# Patient Record
Sex: Female | Born: 2000 | Race: White | Hispanic: No | Marital: Single | Smoking: Never smoker
Health system: Southern US, Community
[De-identification: ages and names within clinical notes are randomized; demographics above are authoritative.]

---
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
## 2019-03-17 ENCOUNTER — Other Ambulatory Visit: Payer: Self-pay

## 2019-03-17 DIAGNOSIS — Z20822 Contact with and (suspected) exposure to covid-19: Secondary | ICD-10-CM

## 2019-03-18 LAB — NOVEL CORONAVIRUS, NAA: SARS-CoV-2, NAA: NOT DETECTED

## 2019-03-23 ENCOUNTER — Other Ambulatory Visit: Payer: Self-pay

## 2019-03-23 DIAGNOSIS — Z20822 Contact with and (suspected) exposure to covid-19: Secondary | ICD-10-CM

## 2019-03-24 LAB — NOVEL CORONAVIRUS, NAA: SARS-CoV-2, NAA: NOT DETECTED

## 2019-03-30 ENCOUNTER — Other Ambulatory Visit: Payer: Self-pay

## 2019-03-30 DIAGNOSIS — Z20822 Contact with and (suspected) exposure to covid-19: Secondary | ICD-10-CM

## 2019-03-31 LAB — NOVEL CORONAVIRUS, NAA: SARS-CoV-2, NAA: NOT DETECTED

## 2019-04-29 ENCOUNTER — Other Ambulatory Visit: Payer: Self-pay | Admitting: *Deleted

## 2019-04-29 DIAGNOSIS — Z20822 Contact with and (suspected) exposure to covid-19: Secondary | ICD-10-CM

## 2019-05-01 LAB — NOVEL CORONAVIRUS, NAA: SARS-CoV-2, NAA: NOT DETECTED

## 2019-05-10 ENCOUNTER — Other Ambulatory Visit: Payer: Self-pay

## 2019-05-10 ENCOUNTER — Telehealth: Payer: No Typology Code available for payment source | Admitting: Family Medicine

## 2019-05-10 DIAGNOSIS — Z20822 Contact with and (suspected) exposure to covid-19: Secondary | ICD-10-CM

## 2019-05-12 LAB — NOVEL CORONAVIRUS, NAA: SARS-CoV-2, NAA: NOT DETECTED

## 2019-07-22 ENCOUNTER — Other Ambulatory Visit: Payer: Self-pay | Admitting: Family Medicine

## 2019-07-22 ENCOUNTER — Ambulatory Visit
Admission: RE | Admit: 2019-07-22 | Discharge: 2019-07-22 | Disposition: A | Discharge status: Home / Self Care | Payer: PRIVATE HEALTH INSURANCE | Source: Ambulatory Visit | Attending: Family Medicine | Admitting: Family Medicine

## 2019-07-22 ENCOUNTER — Other Ambulatory Visit: Payer: Self-pay

## 2019-07-22 DIAGNOSIS — M79645 Pain in left finger(s): Secondary | ICD-10-CM

## 2019-07-22 DIAGNOSIS — M79642 Pain in left hand: Secondary | ICD-10-CM

## 2019-10-24 ENCOUNTER — Other Ambulatory Visit: Payer: Self-pay | Admitting: Family

## 2019-10-24 ENCOUNTER — Ambulatory Visit: Payer: Self-pay | Admitting: Family

## 2019-10-24 DIAGNOSIS — U071 COVID-19: Secondary | ICD-10-CM

## 2019-10-24 NOTE — Progress Notes (Signed)
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  EKG obtained and sent for check off by cardiology.  Pts will be notified of all results.

## 2019-11-10 ENCOUNTER — Ambulatory Visit: Payer: PRIVATE HEALTH INSURANCE | Admitting: Family

## 2019-11-10 ENCOUNTER — Other Ambulatory Visit: Payer: Self-pay

## 2019-11-10 VITALS — BP 125/73 | HR 95 | Temp 98.8°F | Resp 14

## 2019-11-10 DIAGNOSIS — Z8616 Personal history of COVID-19: Secondary | ICD-10-CM

## 2019-11-10 NOTE — Progress Notes (Signed)
   Acute Office Visit  Subjective:    Patient ID: Monica Wall, female    DOB: [birth year], 19 y.o.   MRN: [id number]  Chief Complaint  Patient presents with  . Follow-up    HPI Patient is in today for release to play post Covid.  [employer] and [employer] Med guidelines recently changed stating that asymptomatic/mild symptom patients may be released for gradual return to play without a cardiac workup pending provider exam and judgement.  Pt had 100 degree temp on day 1 post 2nd Covid 19 vaccine. Pt tested positive a few days after vaccine.  Mild congestion.  Denies any hx of chest pain, palpitations or SOB.  Pt  Has been asymptomatic for over 2 weeks.  No past medical history on file.  Past Surgical History:  Procedure Laterality Date  . KNEE SURGERY Right 08/2017    No family history on file.    Allergies  Allergen Reactions  . Bee Venom Hives  . Cefzil [Cefprozil] Hives  . Penicillins   . Zithromax [Azithromycin] Hives    Review of Systems  Constitutional: Negative.   HENT: Positive for congestion.   Respiratory: Negative.   Cardiovascular: Negative.   Gastrointestinal: Negative.        Objective:    Physical Exam Constitutional:      Appearance: Normal appearance. She is normal weight.  HENT:     Right Ear: Tympanic membrane and ear canal normal.     Left Ear: Tympanic membrane and ear canal normal.     Nose:     Comments: Nasal mucosa pale and swollen.    Mouth/Throat:     Mouth: Mucous membranes are moist.     Pharynx: Oropharynx is clear.  Cardiovascular:     Rate and Rhythm: Normal rate and regular rhythm.     Heart sounds: Normal heart sounds.  Pulmonary:     Effort: Pulmonary effort is normal.     Breath sounds: Normal breath sounds.  Musculoskeletal:     Cervical back: Neck supple.  Skin:    General: Skin is warm and dry.  Neurological:     Mental Status: She is alert.     BP 125/73   Pulse 95   Temp 98.8 F (37.1 C) (Temporal)   Resp 14    SpO2 100%  Wt Readings from Last 3 Encounters:  No data found for Wt      Assessment & Plan:   Problem List Items Addressed This Visit    None    Visit Diagnoses    History of 2019 novel coronavirus disease (COVID-19)    -  Primary    Reviewed health exam with mild seasonal allergy findings.  Pt will cont otc Zyrtec and add Flonase to help with symptoms.  Pt is cleared for gradual return to play.  Echo and troponin not advised.  Pt had a normal EKG.  See scanned docs.   No orders of the defined types were placed in this encounter.    WOOD, Deirdre Peer, NP

## 2019-11-11 ENCOUNTER — Other Ambulatory Visit: Payer: PRIVATE HEALTH INSURANCE

## 2019-12-12 IMAGING — CR DG HIP (WITH OR WITHOUT PELVIS) 2-3V*L*
1 series · 3 of 3 positions shown · non-contrast
Comparison: No prior.

CLINICAL DATA: Pain.  No known injury.

EXAM:
DG HIP (WITH OR WITHOUT PELVIS) 2-3V LEFT

[Series 1: dg hip unilat w or w/o pelvis 2-3 views  · non-contrast · 0.14mm/px · 3 of 3 slices shown]
[im 1/3]
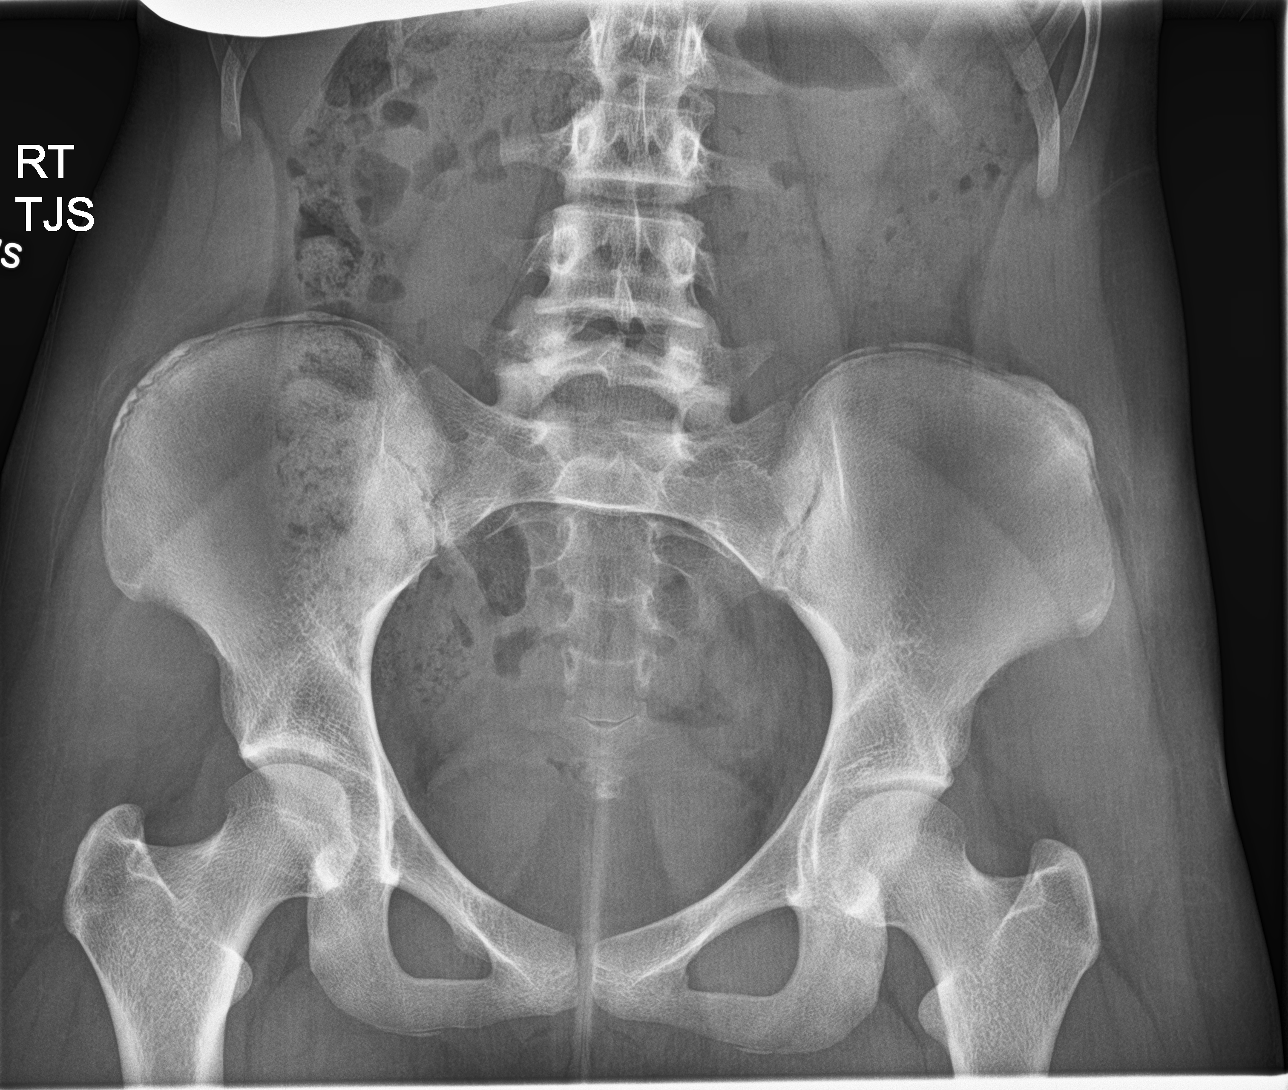
[im 2/3]
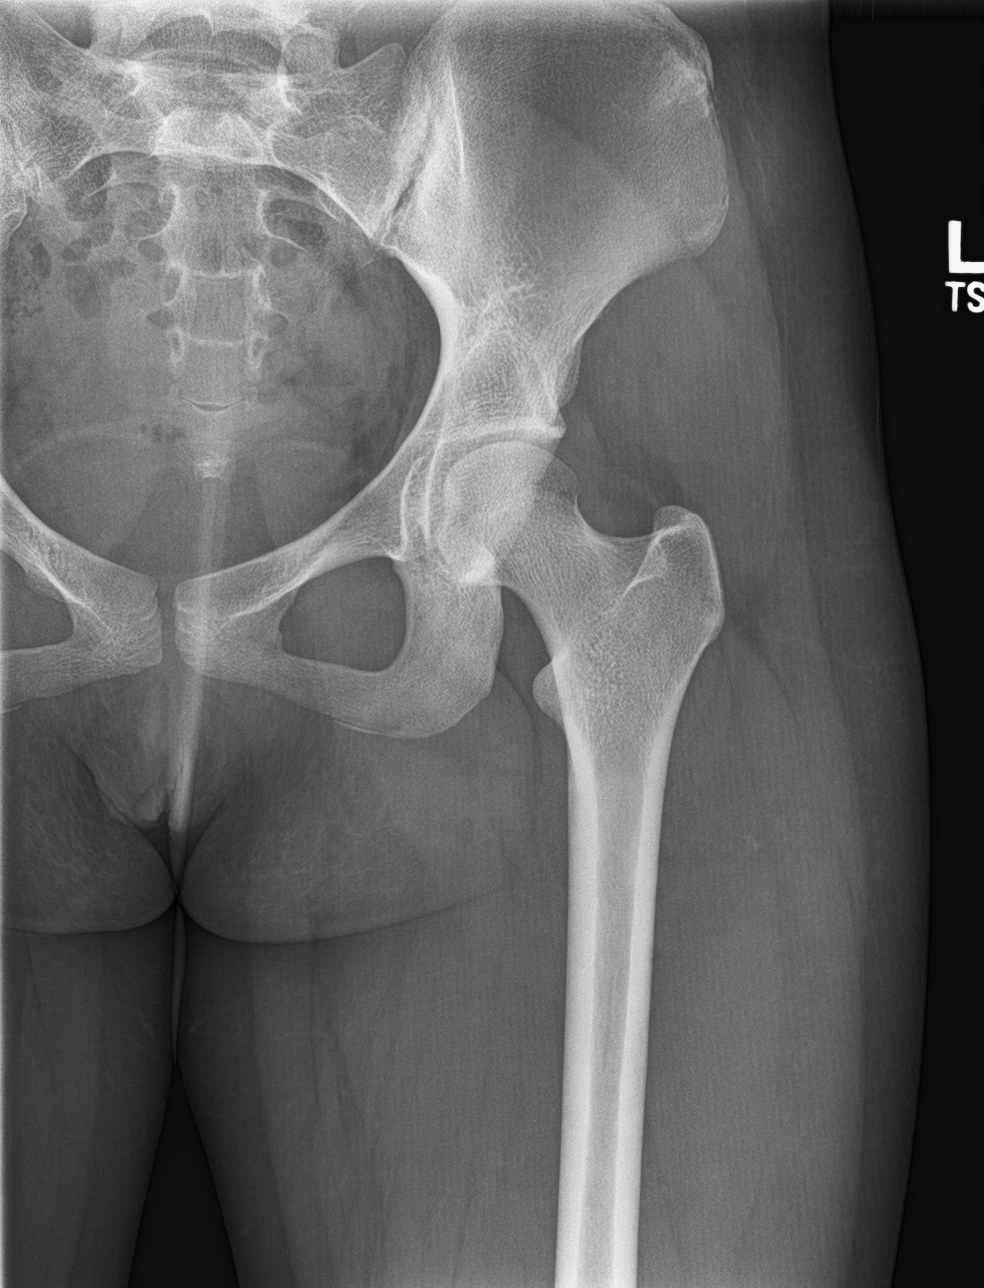
[im 3/3]
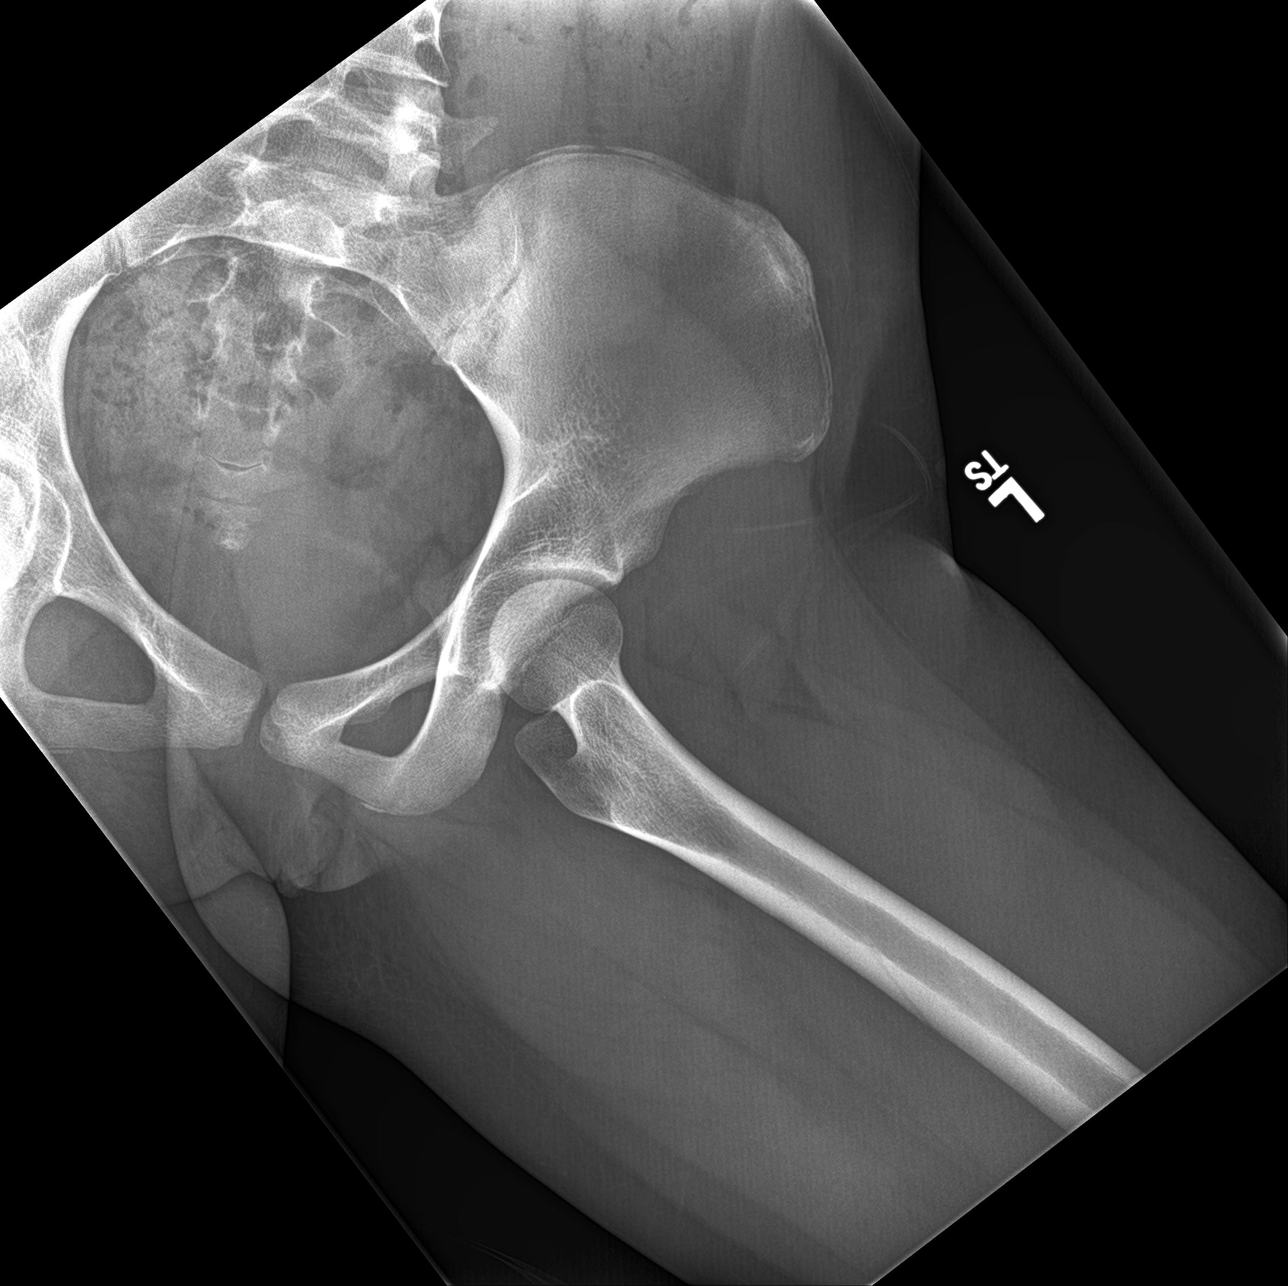

[3 of 3 positions shown; findings below may reference images not displayed]

FINDINGS: No acute bony or joint abnormality identified. Soft tissue
structures are unremarkable.
IMPRESSION: No acute abnormality.

## 2021-04-16 IMAGING — CR DG FINGER THUMB 2+V*L*
1 series · 3 of 3 positions shown · non-contrast
Comparison: None

CLINICAL DATA: Injury to LEFT thumb playing lacrosse, pain at
proximal aspect into hand/thenar eminence

EXAM:
LEFT THUMB 2+V

[Series 1: dg finger thumb left · 0.14mm/px · 3 of 3 slices shown]
[im 1/3]
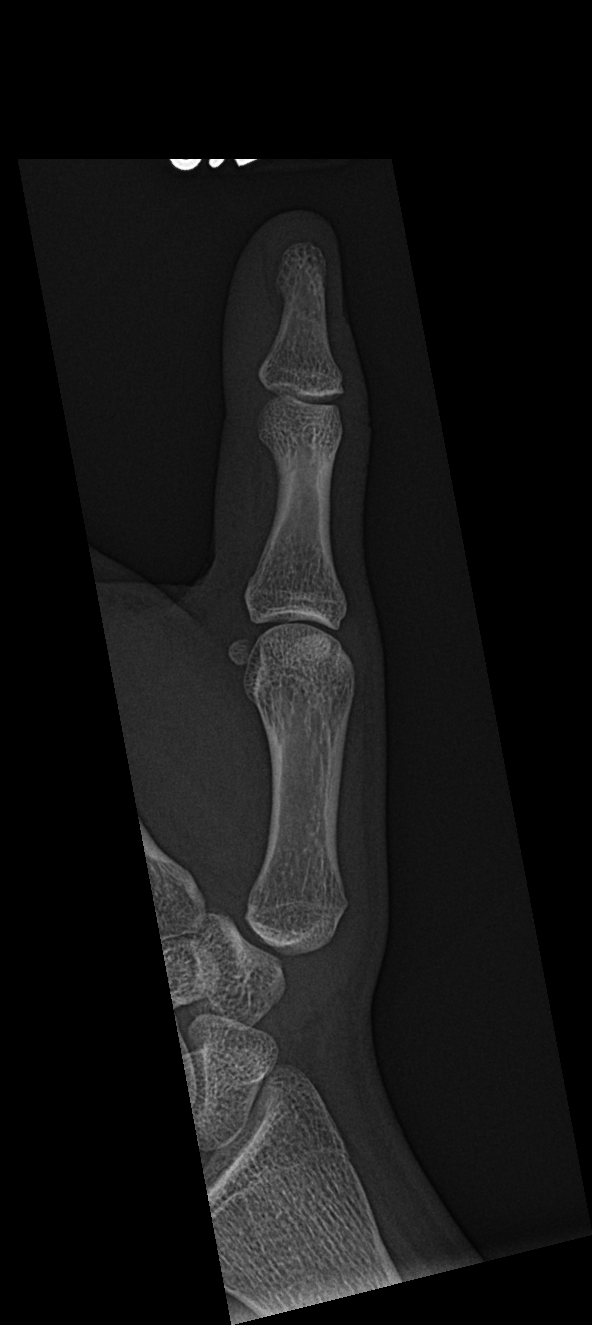
[im 2/3]
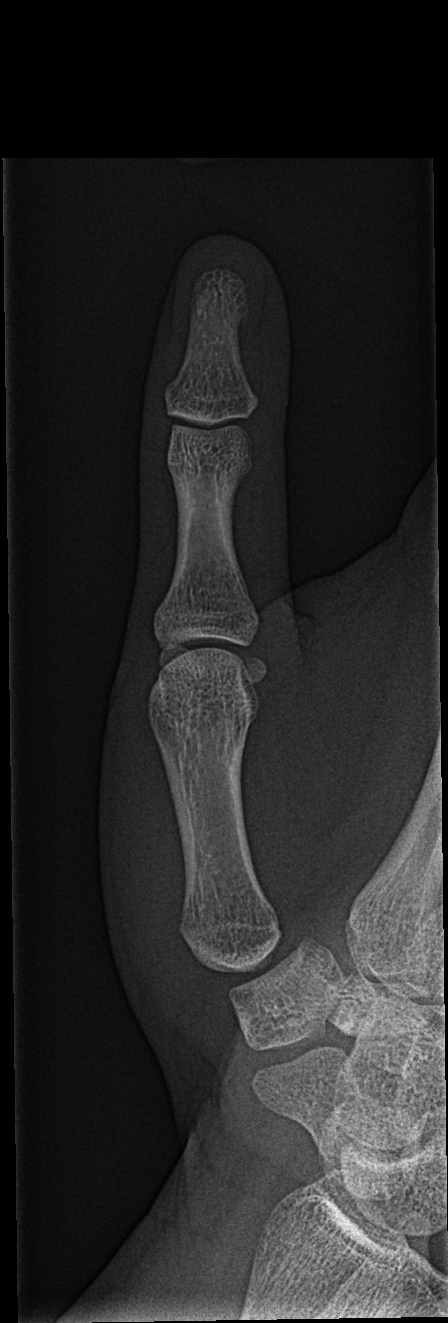
[im 3/3]
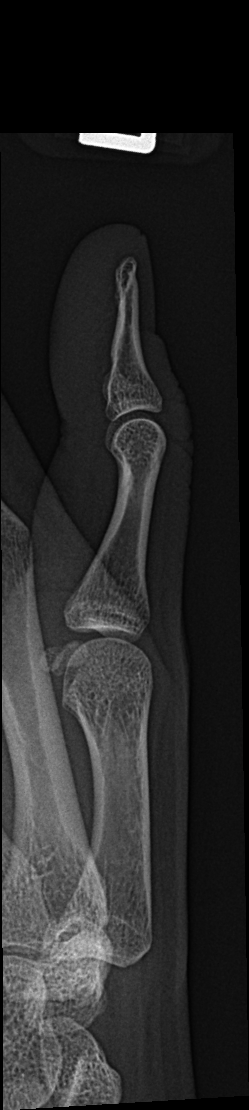

[3 of 3 positions shown; findings below may reference images not displayed]

FINDINGS: Osseous mineralization normal.

Joint spaces preserved.

No fracture, dislocation, or bone destruction.
IMPRESSION: Normal exam.
